# Patient Record
Sex: Male | Born: 1990 | Race: Black or African American | Hispanic: No | Marital: Single | Smoking: Former smoker
Health system: Southern US, Community
[De-identification: ages and names within clinical notes are randomized; demographics above are authoritative.]

---

## 2012-10-16 ENCOUNTER — Emergency Department (HOSPITAL_COMMUNITY)
Admission: EM | Admit: 2012-10-16 | Discharge: 2012-10-16 | Disposition: A | Discharge status: Home / Self Care | Payer: Self-pay | Attending: Emergency Medicine | Admitting: Emergency Medicine

## 2012-10-16 ENCOUNTER — Encounter (HOSPITAL_COMMUNITY): Payer: Self-pay | Admitting: Emergency Medicine

## 2012-10-16 DIAGNOSIS — Z79899 Other long term (current) drug therapy: Secondary | ICD-10-CM | POA: Insufficient documentation

## 2012-10-16 DIAGNOSIS — Z2089 Contact with and (suspected) exposure to other communicable diseases: Secondary | ICD-10-CM | POA: Insufficient documentation

## 2012-10-16 MED ORDER — PERMETHRIN 5 % EX CREA: TOPICAL_CREAM | CUTANEOUS | Status: AC

## 2012-10-16 NOTE — ED Provider Notes (Signed)
CSN: [id number]     Arrival date & time 10/16/12  1926 History  This chart was scribed for non-physician practitioner, Ivonne Andrew, PA-C, working with Enid Skeens, MD by Ronal Fear, ED scribe. This patient was seen in room [facility] and the patient's care was started at 8:21 PM.    Chief Complaint  Patient presents with  . Possible Scabies     The history is provided by the patient. No language interpreter was used.    HPI Comments: Hector Hudson is a 22 y.o. male who presents to the Emergency Department complaining of concern about scabies exposure upon referral from work. He lives with his cousin who was recently dx this weekend with scabies. Pt has moved out into a hotel room, he does not share the bathroom, bed, or grooming tools with his cousin.  Pt has no prior occurences or exposures with or to scabies. Pt denies itching, rash, fever, chills or sweats. Pt works at [employer] in [employer].     History reviewed. No pertinent past medical history. History reviewed. No pertinent past surgical history. History reviewed. No pertinent family history. History  Substance Use Topics  . Smoking status: Never Smoker   . Smokeless tobacco: Never Used  . Alcohol Use: Yes     Comment: socially    Review of Systems  Constitutional: Negative for fever.  Skin: Negative for rash.  All other systems reviewed and are negative.    Allergies  Review of patient's allergies indicates no known allergies.  Home Medications   Current Outpatient Rx  Name  Route  Sig  Dispense  Refill  . loratadine (CLARITIN) 10 MG tablet   Oral   Take 10 mg by mouth daily.          BP 114/59  Pulse 59  Temp(Src) 98.6 F (37 C) (Oral)  Resp 16  Ht 5\' 11"  (1.803 m)  Wt 190 lb (86.183 kg)  BMI 26.51 kg/m2  SpO2 100% Physical Exam  Nursing note and vitals reviewed. Constitutional: He is oriented to person, place, and time. He appears well-developed and well-nourished. No distress.   HENT:  Head: Normocephalic and atraumatic.  Eyes: EOM are normal.  Neck: Neck supple. No tracheal deviation present.  Cardiovascular: Normal rate, regular rhythm and normal heart sounds.   Pulmonary/Chest: Effort normal and breath sounds normal. No respiratory distress.  Musculoskeletal: Normal range of motion.  Neurological: He is alert and oriented to person, place, and time.  Skin: Skin is warm and dry. No rash noted.  Psychiatric: He has a normal mood and affect. His behavior is normal.    ED Course  Procedures   DIAGNOSTIC STUDIES: Oxygen Saturation is 100% on RA, normal by my interpretation.    COORDINATION OF CARE: 9:34 PM- Pt advised of plan for treatment including the signs of scabies infection, and prx for prometherin cream  and pt agrees.      MDM   1. Exposure to scabies     Patient seen and evaluated. Patient appears well without complaints. No signs of rash or scabies at this time.    I personally performed the services described in this documentation, which was scribed in my presence. The recorded information has been reviewed and is accurate.   Angus Seller, PA-C 10/16/12 2203
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
## 2012-10-17 NOTE — ED Provider Notes (Signed)
Medical screening examination/treatment/procedure(s) were performed by non-physician practitioner and as supervising physician I was immediately available for consultation/collaboration.   Enid Skeens, MD 10/17/12 [phone]

## 2014-04-01 ENCOUNTER — Emergency Department (HOSPITAL_COMMUNITY): Payer: No Typology Code available for payment source

## 2014-04-01 ENCOUNTER — Encounter (HOSPITAL_COMMUNITY): Payer: Self-pay | Admitting: *Deleted

## 2014-04-01 ENCOUNTER — Emergency Department (HOSPITAL_COMMUNITY)
Admission: EM | Admit: 2014-04-01 | Discharge: 2014-04-01 | Disposition: A | Discharge status: Home / Self Care | Payer: No Typology Code available for payment source | Attending: Emergency Medicine | Admitting: Emergency Medicine

## 2014-04-01 DIAGNOSIS — R05 Cough: Secondary | ICD-10-CM | POA: Diagnosis not present

## 2014-04-01 DIAGNOSIS — R0789 Other chest pain: Secondary | ICD-10-CM | POA: Insufficient documentation

## 2014-04-01 DIAGNOSIS — R079 Chest pain, unspecified: Secondary | ICD-10-CM | POA: Diagnosis present

## 2014-04-01 DIAGNOSIS — Z79899 Other long term (current) drug therapy: Secondary | ICD-10-CM | POA: Diagnosis not present

## 2014-04-01 DIAGNOSIS — Z87891 Personal history of nicotine dependence: Secondary | ICD-10-CM | POA: Insufficient documentation

## 2014-04-01 LAB — CBC WITH DIFFERENTIAL/PLATELET
Basophils Absolute: 0.1 10*3/uL (ref 0.0–0.1)
Basophils Relative: 1 % (ref 0–1)
EOS PCT: 12 % — AB (ref 0–5)
Eosinophils Absolute: 0.8 10*3/uL — ABNORMAL HIGH (ref 0.0–0.7)
HEMATOCRIT: 43.1 % (ref 39.0–52.0)
HEMOGLOBIN: 14.5 g/dL (ref 13.0–17.0)
LYMPHS PCT: 24 % (ref 12–46)
Lymphs Abs: 1.5 10*3/uL (ref 0.7–4.0)
MCH: 29.2 pg (ref 26.0–34.0)
MCHC: 33.6 g/dL (ref 30.0–36.0)
MCV: 86.9 fL (ref 78.0–100.0)
MONO ABS: 1 10*3/uL (ref 0.1–1.0)
MONOS PCT: 15 % — AB (ref 3–12)
NEUTROS ABS: 3.1 10*3/uL (ref 1.7–7.7)
Neutrophils Relative %: 48 % (ref 43–77)
Platelets: 237 10*3/uL (ref 150–400)
RBC: 4.96 MIL/uL (ref 4.22–5.81)
RDW: 13 % (ref 11.5–15.5)
WBC: 6.3 10*3/uL (ref 4.0–10.5)

## 2014-04-01 LAB — TROPONIN I: Troponin I: <0.03 ng/mL (ref <0.031)

## 2014-04-01 LAB — BASIC METABOLIC PANEL
Anion gap: 4 — ABNORMAL LOW (ref 5–15)
BUN: 9 mg/dL (ref 6–23)
CALCIUM: 8.5 mg/dL (ref 8.4–10.5)
CO2: 27 mmol/L (ref 19–32)
Chloride: 107 mmol/L (ref 96–112)
Creatinine, Ser: 0.96 mg/dL (ref 0.50–1.35)
GLUCOSE: 90 mg/dL (ref 70–99)
POTASSIUM: 4.6 mmol/L (ref 3.5–5.1)
Sodium: 138 mmol/L (ref 135–145)

## 2014-04-01 MED ORDER — IBUPROFEN 600 MG PO TABS: 600.0000 mg | ORAL_TABLET | Freq: Three times a day (TID) | ORAL | Status: DC

## 2014-04-01 MED ORDER — IBUPROFEN 200 MG PO TABS
600.0000 mg | ORAL_TABLET | Freq: Once | ORAL | Status: AC
  Administered 2014-04-01: 600 mg via ORAL
  Filled 2014-04-01: qty 3

## 2014-04-01 NOTE — Discharge Instructions (Signed)

## 2014-04-01 NOTE — ED Provider Notes (Signed)
CSN: [id number]     Arrival date & time 04/01/14  0307 History   First MD Initiated Contact with Patient 04/01/14 [phone]     Chief Complaint  Patient presents with  . Chest Pain     (Consider location/radiation/quality/duration/timing/severity/associated sxs/prior Treatment) HPI Patient presents with episodic sharp left-sided parasternal pain. Does not radiate. Exacerbated with movement and palpation. Currently has no pain. Admits to recent nonproductive cough. No heavy lifting or chest trauma. No fever or chills. No lower sugary swelling or pain. No recent extended travel or surgeries. No family history of coronary artery or thromboembolic disease. History reviewed. No pertinent past medical history. History reviewed. No pertinent past surgical history. No family history on file. History  Substance Use Topics  . Smoking status: Former Games developermoker  . Smokeless tobacco: Never Used  . Alcohol Use: Yes     Comment: socially    Review of Systems  Constitutional: Negative for fever and chills.  Respiratory: Positive for cough. Negative for shortness of breath.   Cardiovascular: Positive for chest pain. Negative for palpitations and leg swelling.  Gastrointestinal: Negative for nausea, vomiting and abdominal pain.  Musculoskeletal: Negative for myalgias, back pain, neck pain and neck stiffness.  Skin: Negative for rash and wound.  Neurological: Negative for dizziness, weakness, light-headedness, numbness and headaches.  All other systems reviewed and are negative.     Allergies  Review of patient's allergies indicates no known allergies.  Home Medications   Prior to Admission medications   Medication Sig Start Date End Date Taking? Authorizing Provider  ibuprofen (ADVIL,MOTRIN) 600 MG tablet Take 1 tablet (600 mg total) by mouth 3 (three) times daily after meals. 04/01/14   Loren Raceravid Yamira Papa, MD  loratadine (CLARITIN) 10 MG tablet Take 10 mg by mouth daily.    Historical Provider, MD   permethrin (ELIMITE) 5 % cream Apply to affected area once 10/16/12   Phill MutterPeter S Dammen, PA-C   BP 115/65 mmHg  Pulse 67  Temp(Src) 98.8 F (37.1 C) (Oral)  Resp 20  Ht 6' (1.829 m)  Wt 200 lb (90.719 kg)  BMI 27.12 kg/m2  SpO2 98% Physical Exam  Constitutional: He is oriented to person, place, and time. He appears well-developed and well-nourished. No distress.  HENT:  Head: Normocephalic and atraumatic.  Mouth/Throat: Oropharynx is clear and moist.  Eyes: EOM are normal. Pupils are equal, round, and reactive to light.  Neck: Normal range of motion. Neck supple.  Cardiovascular: Normal rate and regular rhythm.  Exam reveals no gallop and no friction rub.   No murmur heard. Pulmonary/Chest: Effort normal and breath sounds normal. No respiratory distress. He has no wheezes. He has no rales. He exhibits no tenderness.  Abdominal: Soft. Bowel sounds are normal. He exhibits no distension and no mass. There is no tenderness. There is no rebound and no guarding.  Musculoskeletal: Normal range of motion. He exhibits no edema or tenderness.  No calf swelling or tenderness.  Neurological: He is alert and oriented to person, place, and time.  Skin: Skin is warm and dry. No rash noted. No erythema.  Psychiatric: He has a normal mood and affect. His behavior is normal.  Nursing note and vitals reviewed.   ED Course  Procedures (including critical care time) Labs Review Labs Reviewed  CBC WITH DIFFERENTIAL/PLATELET - Abnormal; Notable for the following:    Monocytes Relative 15 (*)    Eosinophils Relative 12 (*)    Eosinophils Absolute 0.8 (*)    All other components within  normal limits  BASIC METABOLIC PANEL - Abnormal; Notable for the following:    Anion gap 4 (*)    All other components within normal limits  TROPONIN I    Imaging Review Dg Chest 2 View  04/01/2014   CLINICAL DATA:  Mid chest pain.  EXAM: CHEST  2 VIEW  COMPARISON:  None.  FINDINGS: The cardiomediastinal contours  are normal. The lungs are clear. Pulmonary vasculature is normal. No consolidation, pleural effusion, or pneumothorax. No acute osseous abnormalities are seen.  IMPRESSION: No acute pulmonary process.   Electronically Signed   By: Rubye Oaks M.D.   On: 04/01/2014 04:05     EKG Interpretation None      Date: 04/01/2014  Rate: 68  Rhythm: normal sinus rhythm  QRS Axis: normal  Intervals: normal  ST/T Wave abnormalities: normal  Conduction Disutrbances:none  Narrative Interpretation:   Old EKG Reviewed: none available   MDM   Final diagnoses:  Atypical chest pain    Patient presents with episodic parasternal pain. Pain is currently resolved. Chest x-ray without any acute findings. EKG without evidence of ischemia. Patient is PERC and suspicion for PE is very low. Questionable pleurisy versus costochondritis versus musculoskeletal pain. We will start patient on ibuprofen. He's been given return precautions and has voiced understanding.    Loren Racer, MD 04/01/14 [phone]

## 2014-04-01 NOTE — ED Notes (Signed)
Patient presents stating he has right sided chest pain that comes and goes

## 2014-04-01 NOTE — ED Notes (Signed)
Pt A&OX4, ambulatory at d/c with steady gait, NAD 

## 2014-06-19 ENCOUNTER — Encounter (HOSPITAL_COMMUNITY): Payer: Self-pay | Admitting: Emergency Medicine

## 2014-06-19 ENCOUNTER — Emergency Department (HOSPITAL_COMMUNITY)
Admission: EM | Admit: 2014-06-19 | Discharge: 2014-06-19 | Disposition: A | Discharge status: Home / Self Care | Payer: No Typology Code available for payment source | Attending: Emergency Medicine | Admitting: Emergency Medicine

## 2014-06-19 ENCOUNTER — Emergency Department (HOSPITAL_COMMUNITY): Payer: No Typology Code available for payment source

## 2014-06-19 DIAGNOSIS — S0992XA Unspecified injury of nose, initial encounter: Secondary | ICD-10-CM | POA: Diagnosis not present

## 2014-06-19 DIAGNOSIS — Z79899 Other long term (current) drug therapy: Secondary | ICD-10-CM | POA: Insufficient documentation

## 2014-06-19 DIAGNOSIS — S62306A Unspecified fracture of fifth metacarpal bone, right hand, initial encounter for closed fracture: Secondary | ICD-10-CM | POA: Diagnosis not present

## 2014-06-19 DIAGNOSIS — W228XXA Striking against or struck by other objects, initial encounter: Secondary | ICD-10-CM | POA: Diagnosis not present

## 2014-06-19 DIAGNOSIS — Y9289 Other specified places as the place of occurrence of the external cause: Secondary | ICD-10-CM | POA: Diagnosis not present

## 2014-06-19 DIAGNOSIS — Y9389 Activity, other specified: Secondary | ICD-10-CM | POA: Insufficient documentation

## 2014-06-19 DIAGNOSIS — Z720 Tobacco use: Secondary | ICD-10-CM | POA: Insufficient documentation

## 2014-06-19 DIAGNOSIS — J3489 Other specified disorders of nose and nasal sinuses: Secondary | ICD-10-CM

## 2014-06-19 DIAGNOSIS — S62318A Displaced fracture of base of other metacarpal bone, initial encounter for closed fracture: Secondary | ICD-10-CM

## 2014-06-19 DIAGNOSIS — S6991XA Unspecified injury of right wrist, hand and finger(s), initial encounter: Secondary | ICD-10-CM | POA: Diagnosis present

## 2014-06-19 DIAGNOSIS — Y998 Other external cause status: Secondary | ICD-10-CM | POA: Diagnosis not present

## 2014-06-19 MED ORDER — NAPROXEN 250 MG PO TABS: 250.0000 mg | ORAL_TABLET | Freq: Two times a day (BID) | ORAL | Status: DC

## 2014-06-19 NOTE — ED Notes (Signed)
Ortho at bedside to place splint.
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
## 2014-06-19 NOTE — Progress Notes (Signed)
Orthopedic Tech Progress Note Patient Details:  Hector PattenLetron Hudson 07/03/1990 [id number]  Ortho Devices Type of Ortho Device: Arm sling, Ace wrap, Ulna gutter splint Ortho Device/Splint Location: RUE Ortho Device/Splint Interventions: Application   Asia R Thompson 06/19/2014, 11:56 AM
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
## 2014-06-19 NOTE — Discharge Instructions (Signed)
Boxer's Fracture You have a break (fracture) of the fifth metacarpal bone. This is commonly called a boxer's fracture. This is the bone in the hand where the little finger attaches. The fracture is in the end of that bone, closest to the little finger. It is usually caused when you hit an object with a clenched fist. Often, the knuckle is pushed down by the impact. Sometimes, the fracture rotates out of position. A boxer's fracture will usually heal within 6 weeks, if it is treated properly and protected from re-injury. Surgery is sometimes needed. A cast, splint, or bulky hand dressing may be used to protect and immobilize a boxer's fracture. Do not remove this device or dressing until your caregiver approves. Keep your hand elevated, and apply ice packs for 15-20 minutes every 2 hours, for the first 2 days. Elevation and ice help reduce swelling and relieve pain. See your caregiver, or an orthopedic specialist, for follow-up care within the next 10 days. This is to make sure your fracture is healing properly. Document Released: 01/24/2005 Document Revised: 04/18/2011 Document Reviewed: 07/14/2006 [facility] Patient Information 2015 [place], [place]. This information is not intended to replace advice given to you by your health care provider. Make sure you discuss any questions you have with your health care provider. Nosebleed Nosebleeds can be caused by many conditions, including trauma, infections, polyps, foreign bodies, dry mucous membranes or climate, medicines, and air conditioning. Most nosebleeds occur in the front of the nose. Because of this location, most nosebleeds can be controlled by pinching the nostrils gently and continuously for at least 10 to 20 minutes. The long, continuous pressure allows enough time for the blood to clot. If pressure is released during that 10 to 20 minute time period, the process may have to be started again. The nosebleed may stop by itself or quit with pressure, or it  may need concentrated heating (cautery) or pressure from packing. HOME CARE INSTRUCTIONS   If your nose was packed, try to maintain the pack inside until your health care provider removes it. If a gauze pack was used and it starts to fall out, gently replace it or cut the end off. Do not cut if a balloon catheter was used to pack the nose. Otherwise, do not remove unless instructed.  Avoid blowing your nose for 12 hours after treatment. This could dislodge the pack or clot and start the bleeding again.  If the bleeding starts again, sit up and bend forward, gently pinching the front half of your nose continuously for 20 minutes.  If bleeding was caused by dry mucous membranes, use over-the-counter saline nasal spray or gel. This will keep the mucous membranes moist and allow them to heal. If you must use a lubricant, choose the water-soluble variety. Use it only sparingly and not within several hours of lying down.  Do not use petroleum jelly or mineral oil, as these may drip into the lungs and cause serious problems.  Maintain humidity in your home by using less air conditioning or by using a humidifier.  Do not use aspirin or medicines which make bleeding more likely. Your health care provider can give you recommendations on this.  Resume normal activities as you are able, but try to avoid straining, lifting, or bending at the waist for several days.  If the nosebleeds become recurrent and the cause is unknown, your health care provider may suggest laboratory tests. SEEK MEDICAL CARE IF: You have a fever. SEEK IMMEDIATE MEDICAL CARE IF:  Bleeding recurs and cannot be controlled.  There is unusual bleeding from or bruising on other parts of the body.  Nosebleeds continue.  There is any worsening of the condition which originally brought you in.  You become light-headed, feel faint, become sweaty, or vomit blood. MAKE SURE YOU:   Understand these instructions.  Will watch your  condition.  Will get help right away if you are not doing well or get worse. Document Released: 11/03/2004 Document Revised: 06/10/2013 Document Reviewed: 12/25/2008 [facility]Exit[facility] Patient Information 2015 [place], [place]. This information is not intended to replace advice given to you by your health care provider. Make sure you discuss any questions you have with your health care provider. Cast or Splint Care Casts and splints support injured limbs and keep bones from moving while they heal. It is important to care for your cast or splint at home.  HOME CARE INSTRUCTIONS  Keep the cast or splint uncovered during the drying period. It can take 24 to 48 hours to dry if it is made of plaster. A fiberglass cast will dry in less than 1 hour.  Do not rest the cast on anything harder than a pillow for the first 24 hours.  Do not put weight on your injured limb or apply pressure to the cast until your health care provider gives you permission.  Keep the cast or splint dry. Wet casts or splints can lose their shape and may not support the limb as well. A wet cast that has lost its shape can also create harmful pressure on your skin when it dries. Also, wet skin can become infected.  Cover the cast or splint with a plastic bag when bathing or when out in the rain or snow. If the cast is on the trunk of the body, take sponge baths until the cast is removed.  If your cast does become wet, dry it with a towel or a blow dryer on the cool setting only.  Keep your cast or splint clean. Soiled casts may be wiped with a moistened cloth.  Do not place any hard or soft foreign objects under your cast or splint, such as cotton, toilet paper, lotion, or powder.  Do not try to scratch the skin under the cast with any object. The object could get stuck inside the cast. Also, scratching could lead to an infection. If itching is a problem, use a blow dryer on a cool setting to relieve discomfort.  Do not trim or cut  your cast or remove padding from inside of it.  Exercise all joints next to the injury that are not immobilized by the cast or splint. For example, if you have a long leg cast, exercise the hip joint and toes. If you have an arm cast or splint, exercise the shoulder, elbow, thumb, and fingers.  Elevate your injured arm or leg on 1 or 2 pillows for the first 1 to 3 days to decrease swelling and pain.It is best if you can comfortably elevate your cast so it is higher than your heart. SEEK MEDICAL CARE IF:   Your cast or splint cracks.  Your cast or splint is too tight or too loose.  You have unbearable itching inside the cast.  Your cast becomes wet or develops a soft spot or area.  You have a bad smell coming from inside your cast.  You get an object stuck under your cast.  Your skin around the cast becomes red or raw.  You have new  pain or worsening pain after the cast has been applied. SEEK IMMEDIATE MEDICAL CARE IF:   You have fluid leaking through the cast.  You are unable to move your fingers or toes.  You have discolored (blue or white), cool, painful, or very swollen fingers or toes beyond the cast.  You have tingling or numbness around the injured area.  You have severe pain or pressure under the cast.  You have any difficulty with your breathing or have shortness of breath.  You have chest pain. Document Released: 01/22/2000 Document Revised: 11/14/2012 Document Reviewed: 08/02/2012 [facility]ExitCare Patient Information 2015 [place]ExitCare, [place]LLC. This information is not intended to replace advice given to you by your health care provider. Make sure you discuss any questions you have with your health care provider.

## 2014-06-19 NOTE — ED Notes (Signed)
Called ortho to come and place ulnar gutter splint.

## 2015-08-21 ENCOUNTER — Encounter (HOSPITAL_COMMUNITY): Payer: Self-pay | Admitting: *Deleted

## 2015-08-21 ENCOUNTER — Emergency Department (HOSPITAL_COMMUNITY)
Admission: EM | Admit: 2015-08-21 | Discharge: 2015-08-21 | Disposition: A | Discharge status: Home / Self Care | Payer: No Typology Code available for payment source | Attending: Emergency Medicine | Admitting: Emergency Medicine

## 2015-08-21 DIAGNOSIS — X509XXA Other and unspecified overexertion or strenuous movements or postures, initial encounter: Secondary | ICD-10-CM | POA: Diagnosis not present

## 2015-08-21 DIAGNOSIS — Y929 Unspecified place or not applicable: Secondary | ICD-10-CM | POA: Diagnosis not present

## 2015-08-21 DIAGNOSIS — Z87891 Personal history of nicotine dependence: Secondary | ICD-10-CM | POA: Insufficient documentation

## 2015-08-21 DIAGNOSIS — Y999 Unspecified external cause status: Secondary | ICD-10-CM | POA: Diagnosis not present

## 2015-08-21 DIAGNOSIS — S3992XA Unspecified injury of lower back, initial encounter: Secondary | ICD-10-CM | POA: Diagnosis present

## 2015-08-21 DIAGNOSIS — Y939 Activity, unspecified: Secondary | ICD-10-CM | POA: Diagnosis not present

## 2015-08-21 DIAGNOSIS — T148XXA Other injury of unspecified body region, initial encounter: Secondary | ICD-10-CM

## 2015-08-21 DIAGNOSIS — S39012A Strain of muscle, fascia and tendon of lower back, initial encounter: Secondary | ICD-10-CM | POA: Insufficient documentation

## 2015-08-21 MED ORDER — NAPROXEN 500 MG PO TABS: 500.0000 mg | ORAL_TABLET | Freq: Two times a day (BID) | ORAL | Status: AC | PRN

## 2015-08-21 MED ORDER — METHOCARBAMOL 500 MG PO TABS: 500.0000 mg | ORAL_TABLET | Freq: Every evening | ORAL | Status: AC | PRN

## 2015-08-21 NOTE — ED Provider Notes (Signed)
CSN: [id number]     Arrival date & time 08/21/15  1332 History  By signing my name below, I, Hector Hudson, attest that this documentation has been prepared under the direction and in the presence of [facility] RiversJaime Leyton Brownlee, PA-C. Electronically Signed: Phillis HaggisGabriella Hudson, ED Scribe. 08/21/2015. 2:10 PM.   Chief Complaint  Patient presents with  . Back Pain   The history is provided by the patient. No language interpreter was used.  HPI Comments: Hector PattenLetron Hudson is a 25 y.o. male who presents to the Emergency Department complaining of sudden onset, sharp right lower back pain onset one day ago. Pt reports that his back pain started after putting furniture together while sitting. He reports worsening pain with bending forward at the waist and at night while trying to sleep. He has tried stretching to relieve the symptoms to no relief. He has not taken anything for his symptoms. He denies gait abnormality, rash, wound, numbness, weakness, bladder or bowel incontinence, saddle anesthesia, fever.   History reviewed. No pertinent past medical history. History reviewed. No pertinent past surgical history. No family history on file. Social History  Substance Use Topics  . Smoking status: Former Smoker    Quit date: 05/09/2015  . Smokeless tobacco: Never Used  . Alcohol Use: Yes     Comment: socially    Review of Systems  Constitutional: Negative for fever.  Musculoskeletal: Positive for back pain. Negative for gait problem.  Skin: Negative for rash and wound.  Neurological: Negative for weakness and numbness.   Allergies  Review of patient's allergies indicates no known allergies.  Home Medications   Prior to Admission medications   Medication Sig Start Date End Date Taking? Authorizing Provider  ibuprofen (ADVIL,MOTRIN) 600 MG tablet Take 1 tablet (600 mg total) by mouth 3 (three) times daily after meals. 04/01/14   Loren Raceravid Yelverton, MD  loratadine (CLARITIN) 10 MG tablet Take 10 mg by mouth daily.     Historical Provider, MD  methocarbamol (ROBAXIN) 500 MG tablet Take 1 tablet (500 mg total) by mouth at bedtime as needed for muscle spasms. 08/21/15   Hector Hudson Hector Barcia, PA-C  naproxen (NAPROSYN) 500 MG tablet Take 1 tablet (500 mg total) by mouth 2 (two) times daily as needed for moderate pain. 08/21/15   Hector Hudson Hector Roseman, PA-C  permethrin (ELIMITE) 5 % cream Apply to affected area once 10/16/12   Ivonne AndrewPeter Dammen, PA-C   BP 115/64 mmHg  Pulse 62  Temp(Src) 98.3 F (36.8 C) (Oral)  Resp 18  Ht 6' (1.829 m)  Wt 85.787 kg  BMI 25.64 kg/m2  SpO2 98% Physical Exam  Constitutional: He is oriented to person, place, and time. He appears well-developed and well-nourished.  NAD  HENT:  Head: Normocephalic and atraumatic.  Neck: Normal range of motion. Neck supple.  Full ROM without pain No midline tenderness No tenderness of paraspinal musculature  Cardiovascular: Normal rate, regular rhythm, normal heart sounds and intact distal pulses.  Exam reveals no gallop and no friction rub.   No murmur heard. Pulmonary/Chest: Effort normal and breath sounds normal. No respiratory distress. He has no wheezes. He has no rales.  Abdominal: Soft. He exhibits no distension. There is no tenderness.  Musculoskeletal: Normal range of motion.  Gait is not antalgic. Full ROM - pain with flexion, no pain with extension or twisting. No noted deformities or signs of inflammation. No overlying skin changes. Curvature of cervical, thoracic, and lumbar spine within normal limits. No midline tenderness; mild tenderness to palpation  of right lumbar paraspinal musculature. Straight leg raises negative bilaterally for radicular symptoms.  5/5 muscle strength of bilateral LE's   Neurological: He is alert and oriented to person, place, and time. He has normal reflexes.  Bilateral lower extremities neurovascularly intact.  Skin: Skin is warm and dry. No rash noted. No erythema.  Psychiatric: He has a normal mood and affect.  His behavior is normal.  Nursing note and vitals reviewed.   ED Course  Procedures (including critical care time) DIAGNOSTIC STUDIES: Oxygen Saturation is 98% on RA, normal by my interpretation.    COORDINATION OF CARE: 2:07 PM-Discussed treatment plan which includes anti-inflammatories and muscle relaxants with pt at bedside and pt agreed to plan.   Labs Review Labs Reviewed - No data to display  Imaging Review No results found. I have personally reviewed and evaluated these images and lab results as part of my medical decision-making.   EKG Interpretation None      MDM   Final diagnoses:  Muscle strain   Hector Hudson presents with back pain. Patient demonstrates no lower extremity weakness, saddle anesthesia, bowel or bladder incontinence, or neuro deficits. No concern for cauda equina. No fevers or other infectious symptoms to suggest that the patient's back pain is due to an infection. Lower extremities are neurovascularly intact and patient is ambulating without difficulty. I have reviewed return precautions, including the development of any of these signs or symptoms, and the patient has voiced understanding. I reviewed supportive care instructions, including NSAIDs, early range of motion exercises, and PCP follow-up if symptoms do not improve. RX for naproxen and robaxin. Patient voiced understanding and agreement with plan.   I personally performed the services described in this documentation, which was scribed in my presence. The recorded information has been reviewed and is accurate.     [facility] Hector Maciver, PA-C 08/21/15 1537  Derwood Kaplan, MD 08/23/15 [phone]

## 2015-08-21 NOTE — Discharge Instructions (Signed)
Naproxen as needed for pain. Robaxin is your muscle relaxer. Use this before bed time only as needed for pain. Ice affected area for additional pain relief. Return to ER for new or worsening symptoms, any additional concerns.  COLD THERAPY DIRECTIONS:  Ice or gel packs can be used to reduce both pain and swelling. Ice is the most helpful within the first 24 to 48 hours after an injury or flareup from overusing a muscle or joint.  Ice is effective, has very few side effects, and is safe for most people to use.   If you expose your skin to cold temperatures for too long or without the proper protection, you can damage your skin or nerves. Watch for signs of skin damage due to cold.   HOME CARE INSTRUCTIONS  Follow these tips to use ice and cold packs safely.  Place a dry or damp towel between the ice and skin. A damp towel will cool the skin more quickly, so you may need to shorten the time that the ice is used.  For a more rapid response, add gentle compression to the ice.  Ice for no more than 10 to 20 minutes at a time. The bonier the area you are icing, the less time it will take to get the benefits of ice.  Check your skin after 5 minutes to make sure there are no signs of a poor response to cold or skin damage.  Rest 20 minutes or more in between uses.  Once your skin is numb, you can end your treatment. You can test numbness by very lightly touching your skin. The touch should be so light that you do not see the skin dimple from the pressure of your fingertip. When using ice, most people will feel these normal sensations in this order: cold, burning, aching, and numbness.

## 2015-08-21 NOTE — ED Notes (Signed)
Pt reports R sided Lower back pain onset last night while putting furniture together, pt ambulatory, denies urinary symptoms, MAE, A&O x4

## 2015-08-22 ENCOUNTER — Encounter (HOSPITAL_COMMUNITY): Payer: Self-pay | Admitting: *Deleted

## 2015-08-22 ENCOUNTER — Emergency Department (HOSPITAL_COMMUNITY)
Admission: EM | Admit: 2015-08-22 | Discharge: 2015-08-22 | Disposition: A | Discharge status: Home / Self Care | Payer: No Typology Code available for payment source | Attending: Emergency Medicine | Admitting: Emergency Medicine

## 2015-08-22 DIAGNOSIS — J020 Streptococcal pharyngitis: Secondary | ICD-10-CM | POA: Insufficient documentation

## 2015-08-22 DIAGNOSIS — F17200 Nicotine dependence, unspecified, uncomplicated: Secondary | ICD-10-CM | POA: Insufficient documentation

## 2015-08-22 LAB — RAPID STREP SCREEN (MED CTR MEBANE ONLY): STREPTOCOCCUS, GROUP A SCREEN (DIRECT): POSITIVE — AB

## 2015-08-22 MED ORDER — HYDROCODONE-ACETAMINOPHEN 5-325 MG PO TABS
1.0000 | ORAL_TABLET | Freq: Once | ORAL | Status: AC
  Administered 2015-08-22: 1 via ORAL
  Filled 2015-08-22: qty 1

## 2015-08-22 MED ORDER — HYDROCODONE-ACETAMINOPHEN 5-325 MG PO TABS: 1.0000 | ORAL_TABLET | Freq: Four times a day (QID) | ORAL | Status: AC | PRN

## 2015-08-22 MED ORDER — IBUPROFEN 600 MG PO TABS: 600.0000 mg | ORAL_TABLET | Freq: Four times a day (QID) | ORAL | Status: AC | PRN

## 2015-08-22 MED ORDER — DEXAMETHASONE 4 MG PO TABS
6.0000 mg | ORAL_TABLET | Freq: Once | ORAL | Status: AC
  Administered 2015-08-22: 6 mg via ORAL
  Filled 2015-08-22: qty 2

## 2015-08-22 MED ORDER — PENICILLIN G BENZATHINE 1200000 UNIT/2ML IM SUSP
1.2000 10*6.[IU] | Freq: Once | INTRAMUSCULAR | Status: AC
  Administered 2015-08-22: 1.2 10*6.[IU] via INTRAMUSCULAR
  Filled 2015-08-22: qty 2

## 2015-08-22 NOTE — Discharge Instructions (Signed)
You have been given an injection of penicillin for your strep throat.  He should not need any further antibiotics.  You have been given a prescription for several tablets of Vicodin to use for severe pain.  Otherwise, please use Tylenol or ibuprofen.  Make sure to stay hydrated.  Drink small amounts frequently

## 2015-08-22 NOTE — ED Provider Notes (Signed)
CSN: [id number]     Arrival date & time 08/22/15  0301 History   First MD Initiated Contact with Patient 08/22/15 [phone]     Chief Complaint  Patient presents with  . Sore Throat     (Consider location/radiation/quality/duration/timing/severity/associated sxs/prior Treatment) HPI Comments: This 25 year old male with a 24-hour history of sore throat, difficulty swallowing.  States he does not know if he's had a fever.  Denies any chills.  He has one known sick contacts.  States his girlfriend.  He does notice that his voice has changed in pitch  Patient is a 25 y.o. male presenting with pharyngitis. The history is provided by the patient.  Sore Throat This is a new problem. The current episode started yesterday. The problem occurs constantly. The problem has been gradually worsening. Associated symptoms include a sore throat and swollen glands. Pertinent negatives include no chills or fever. The symptoms are aggravated by swallowing. He has tried nothing for the symptoms. The treatment provided no relief.    History reviewed. No pertinent past medical history. History reviewed. No pertinent past surgical history. No family history on file. Social History  Substance Use Topics  . Smoking status: Current Every Day Smoker    Last Attempt to Quit: 05/09/2015  . Smokeless tobacco: Never Used  . Alcohol Use: Yes     Comment: socially    Review of Systems  Constitutional: Negative for fever and chills.  HENT: Positive for sore throat and trouble swallowing. Negative for drooling.   Respiratory: Negative for shortness of breath.   All other systems reviewed and are negative.     Allergies  Review of patient's allergies indicates no known allergies.  Home Medications   Prior to Admission medications   Medication Sig Start Date End Date Taking? Authorizing Provider  methocarbamol (ROBAXIN) 500 MG tablet Take 1 tablet (500 mg total) by mouth at bedtime as needed for muscle spasms.  08/21/15  Yes Jaime Pilcher Ward, PA-C  naproxen (NAPROSYN) 500 MG tablet Take 1 tablet (500 mg total) by mouth 2 (two) times daily as needed for moderate pain. 08/21/15  Yes Jaime Pilcher Ward, PA-C  HYDROcodone-acetaminophen (NORCO/VICODIN) 5-325 MG tablet Take 1 tablet by mouth every 6 (six) hours as needed for moderate pain. 08/22/15   Earley FavorGail Bryler Dibble, NP  ibuprofen (ADVIL,MOTRIN) 600 MG tablet Take 1 tablet (600 mg total) by mouth every 6 (six) hours as needed. 08/22/15   Earley FavorGail Timoteo Carreiro, NP  permethrin (ELIMITE) 5 % cream Apply to affected area once Patient not taking: Reported on 08/22/2015 10/16/12   Ivonne AndrewPeter Dammen, PA-C   BP 109/87 mmHg  Pulse 54  Temp(Src) 98.2 F (36.8 C) (Oral)  Resp 18  Ht 6' (1.829 m)  Wt 85.73 kg  BMI 25.63 kg/m2  SpO2 100% Physical Exam  Constitutional: He is oriented to person, place, and time. He appears well-developed and well-nourished.  HENT:  Head: Normocephalic.  Right Ear: External ear normal.  Left Ear: External ear normal.  Mouth/Throat: Uvula is midline. Uvula swelling present. Posterior oropharyngeal edema and posterior oropharyngeal erythema present. No oropharyngeal exudate or tonsillar abscesses.  Neck: Normal range of motion.  Cardiovascular: Normal rate and regular rhythm.   Pulmonary/Chest: Effort normal.  Musculoskeletal: Normal range of motion.  Lymphadenopathy:    He has cervical adenopathy.  Neurological: He is alert and oriented to person, place, and time.  Skin: Skin is warm and dry.  Nursing note and vitals reviewed.   ED Course  Procedures (including critical care time)  Labs Review Labs Reviewed  RAPID STREP SCREEN (NOT AT [facility]) - Abnormal; Notable for the following:    Streptococcus, Group A Screen (Direct) POSITIVE (*)    All other components within normal limits    Imaging Review No results found. I have personally reviewed and evaluated these images and lab results as part of my medical decision-making.   EKG  Interpretation None      MDM   Final diagnoses:  Strep pharyngitis         Earley Favor, NP 08/22/15 1610  Gilda Crease, MD 08/22/15 [phone]

## 2015-08-22 NOTE — ED Notes (Signed)
The pt is c/o throat pain with difficulty swallowing since yesterday
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
## 2017-08-25 ENCOUNTER — Ambulatory Visit (HOSPITAL_COMMUNITY)
Admission: EM | Admit: 2017-08-25 | Discharge: 2017-08-25 | Disposition: A | Discharge status: Home / Self Care | Payer: Self-pay | Attending: Internal Medicine | Admitting: Internal Medicine

## 2017-08-25 DIAGNOSIS — Z23 Encounter for immunization: Secondary | ICD-10-CM

## 2017-08-25 DIAGNOSIS — S61411A Laceration without foreign body of right hand, initial encounter: Secondary | ICD-10-CM

## 2017-08-25 DIAGNOSIS — W268XXA Contact with other sharp object(s), not elsewhere classified, initial encounter: Secondary | ICD-10-CM

## 2017-08-25 MED ORDER — SULFAMETHOXAZOLE-TRIMETHOPRIM 800-160 MG PO TABS: 1.0000 | ORAL_TABLET | Freq: Two times a day (BID) | ORAL | 0 refills | Status: AC

## 2017-08-25 MED ORDER — TETANUS-DIPHTH-ACELL PERTUSSIS 5-2.5-18.5 LF-MCG/0.5 IM SUSP
0.5000 mL | Freq: Once | INTRAMUSCULAR | Status: AC
Start: 2017-08-25 — End: 2017-08-25
  Administered 2017-08-25: 0.5 mL via INTRAMUSCULAR

## 2017-08-25 MED ORDER — TETANUS-DIPHTH-ACELL PERTUSSIS 5-2.5-18.5 LF-MCG/0.5 IM SUSP
INTRAMUSCULAR | Status: AC
  Filled 2017-08-25: qty 0.5
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
## 2017-08-25 NOTE — Discharge Instructions (Signed)
Return if any problems.
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
## 2022-08-05 ENCOUNTER — Encounter (HOSPITAL_COMMUNITY): Payer: Self-pay

## 2022-08-05 ENCOUNTER — Ambulatory Visit (HOSPITAL_COMMUNITY)
Admission: EM | Admit: 2022-08-05 | Discharge: 2022-08-05 | Disposition: A | Discharge status: Home / Self Care | Payer: Self-pay | Attending: Family Medicine | Admitting: Family Medicine

## 2022-08-05 DIAGNOSIS — J039 Acute tonsillitis, unspecified: Secondary | ICD-10-CM

## 2022-08-05 LAB — POCT RAPID STREP A (OFFICE): Rapid Strep A Screen: NEGATIVE

## 2022-08-05 MED ORDER — AMOXICILLIN 875 MG PO TABS: 875.0000 mg | ORAL_TABLET | Freq: Two times a day (BID) | ORAL | 0 refills | Status: AC

## 2022-08-05 NOTE — ED Triage Notes (Signed)
Patient having hard time swallowing, sweats and chills, core throat onset Monday night.  Tried otc cold meds and salt gargles with no relief.

## 2022-08-05 NOTE — ED Provider Notes (Signed)
[facility]    CSN: [id number] Arrival date & time: 08/05/22  4098      History   Chief Complaint Chief Complaint  Patient presents with   Sore Throat    HPI Hector Hudson is a 32 y.o. male.   HPI Patient here with 5 days of sore throat, and difficultly swallowing due to throat pain.  Has a history of previous streptococcal infection.  He has been gargling with salt water and taken over the night cold medication without any improvement.  He endorses chills and cold sweats.  Denies any known recent sick contacts.  To his knowledge she has not had a fever.   History reviewed. No pertinent past medical history.  There are no problems to display for this patient.   History reviewed. No pertinent surgical history.     Home Medications    Prior to Admission medications   Medication Sig Start Date End Date Taking? Authorizing Provider  amoxicillin (AMOXIL) 875 MG tablet Take 1 tablet (875 mg total) by mouth 2 (two) times daily. 08/05/22  Yes Bing Neighbors, NP  HYDROcodone-acetaminophen (NORCO/VICODIN) 5-325 MG tablet Take 1 tablet by mouth every 6 (six) hours as needed for moderate pain. 08/22/15   Earley Favor, NP  ibuprofen (ADVIL,MOTRIN) 600 MG tablet Take 1 tablet (600 mg total) by mouth every 6 (six) hours as needed. 08/22/15   Earley Favor, NP  methocarbamol (ROBAXIN) 500 MG tablet Take 1 tablet (500 mg total) by mouth at bedtime as needed for muscle spasms. 08/21/15   Ward, Chase Picket, PA-C  naproxen (NAPROSYN) 500 MG tablet Take 1 tablet (500 mg total) by mouth 2 (two) times daily as needed for moderate pain. 08/21/15   Ward, Chase Picket, PA-C  permethrin (ELIMITE) 5 % cream Apply to affected area once Patient not taking: Reported on 08/22/2015 10/16/12   Ivonne Andrew, PA-C    Family History History reviewed. No pertinent family history.  Social History Social History   Tobacco Use   Smoking status: Former    Types: Cigarettes    Quit date:  05/09/2015    Years since quitting: 7.2   Smokeless tobacco: Never  Vaping Use   Vaping Use: Never used  Substance Use Topics   Alcohol use: Yes    Comment: socially   Drug use: No     Allergies   Nickel   Review of Systems Review of Systems Pertinent negatives listed in HPI   Physical Exam Triage Vital Signs ED Triage Vitals  Enc Vitals Group     BP 08/05/22 0951 115/80     Pulse Rate 08/05/22 0951 (!) 56     Resp 08/05/22 0951 18     Temp 08/05/22 0951 98.8 F (37.1 C)     Temp Source 08/05/22 0951 Oral     SpO2 08/05/22 0951 97 %     Weight 08/05/22 0950 188 lb 15 oz (85.7 kg)     Height 08/05/22 0950 5\' 11"  (1.803 m)     Head Circumference --      Peak Flow --      Pain Score 08/05/22 0949 7     Pain Loc --      Pain Edu? --      Excl. in GC? --    No data found.  Updated Vital Signs BP 115/80 (BP Location: Right Arm)   Pulse (!) 56   Temp 98.8 F (37.1 C) (Oral)   Resp 18   Ht 5'  11" (1.803 m)   Wt 188 lb 15 oz (85.7 kg)   SpO2 97%   BMI 26.35 kg/m   Visual Acuity Right Eye Distance:   Left Eye Distance:   Bilateral Distance:    Right Eye Near:   Left Eye Near:    Bilateral Near:     Physical Exam   UC Treatments / Results  Labs (all labs ordered are listed, but only abnormal results are displayed) Labs Reviewed  POCT RAPID STREP A (OFFICE)    EKG   Radiology No results found.  Procedures Procedures (including critical care time)  Medications Ordered in UC Medications - No data to display  Initial Impression / Assessment and Plan / UC Course  I have reviewed the triage vital signs and the nursing notes.  Pertinent labs & imaging results that were available during my care of the patient were reviewed by me and considered in my medical decision making (see chart for details).    Patient with a 5-day history of difficulty swallowing on and findings consistent with acute tonsillitis.  Related to streptococcal infection given  patient's history.  Will cover with amoxicillin 875 mg twice daily for period of 10 days.  Advised to complete entire course of medication continue with warm teas for throat comfort.  Work note provided to return to work on 72/2024, patient does not work on weekends.  Return precautions given if symptoms worsen. Final Clinical Impressions(s) / UC Diagnoses   Final diagnoses:  Acute tonsillitis, unspecified etiology   Discharge Instructions   None    ED Prescriptions     Medication Sig Dispense Auth. Provider   amoxicillin (AMOXIL) 875 MG tablet Take 1 tablet (875 mg total) by mouth 2 (two) times daily. 20 tablet Bing Neighbors, NP      PDMP not reviewed this encounter.
